# Patient Record
Sex: Male | Born: 1950 | Race: White | Hispanic: No | Marital: Single | Smoking: Former smoker
Health system: Southern US, Community
[De-identification: ages and names within clinical notes are randomized; demographics above are authoritative.]

## PROBLEM LIST (undated history)

## (undated) DIAGNOSIS — I509 Heart failure, unspecified: Secondary | ICD-10-CM

## (undated) DIAGNOSIS — E119 Type 2 diabetes mellitus without complications: Secondary | ICD-10-CM

## (undated) DIAGNOSIS — J449 Chronic obstructive pulmonary disease, unspecified: Secondary | ICD-10-CM

## (undated) HISTORY — PX: HERNIA REPAIR: SHX51

## (undated) HISTORY — PX: CHOLECYSTECTOMY: SHX55

---

## 2013-09-29 ENCOUNTER — Emergency Department (HOSPITAL_COMMUNITY): Payer: Medicare Other

## 2013-09-29 ENCOUNTER — Emergency Department (HOSPITAL_COMMUNITY)
Admission: EM | Admit: 2013-09-29 | Discharge: 2013-09-29 | Disposition: A | Discharge status: Home / Self Care | Payer: Medicare Other | Attending: Emergency Medicine | Admitting: Emergency Medicine

## 2013-09-29 ENCOUNTER — Encounter (HOSPITAL_COMMUNITY): Payer: Self-pay | Admitting: Emergency Medicine

## 2013-09-29 DIAGNOSIS — S1093XA Contusion of unspecified part of neck, initial encounter: Secondary | ICD-10-CM | POA: Diagnosis not present

## 2013-09-29 DIAGNOSIS — J449 Chronic obstructive pulmonary disease, unspecified: Secondary | ICD-10-CM | POA: Insufficient documentation

## 2013-09-29 DIAGNOSIS — W1809XA Striking against other object with subsequent fall, initial encounter: Secondary | ICD-10-CM | POA: Insufficient documentation

## 2013-09-29 DIAGNOSIS — S0003XA Contusion of scalp, initial encounter: Secondary | ICD-10-CM | POA: Diagnosis not present

## 2013-09-29 DIAGNOSIS — E119 Type 2 diabetes mellitus without complications: Secondary | ICD-10-CM | POA: Diagnosis not present

## 2013-09-29 DIAGNOSIS — T07XXXA Unspecified multiple injuries, initial encounter: Secondary | ICD-10-CM

## 2013-09-29 DIAGNOSIS — Z87891 Personal history of nicotine dependence: Secondary | ICD-10-CM | POA: Diagnosis not present

## 2013-09-29 DIAGNOSIS — I509 Heart failure, unspecified: Secondary | ICD-10-CM | POA: Insufficient documentation

## 2013-09-29 DIAGNOSIS — S0083XA Contusion of other part of head, initial encounter: Principal | ICD-10-CM | POA: Insufficient documentation

## 2013-09-29 DIAGNOSIS — Y9389 Activity, other specified: Secondary | ICD-10-CM | POA: Insufficient documentation

## 2013-09-29 DIAGNOSIS — Y929 Unspecified place or not applicable: Secondary | ICD-10-CM | POA: Insufficient documentation

## 2013-09-29 DIAGNOSIS — IMO0002 Reserved for concepts with insufficient information to code with codable children: Secondary | ICD-10-CM | POA: Diagnosis not present

## 2013-09-29 DIAGNOSIS — S0990XA Unspecified injury of head, initial encounter: Secondary | ICD-10-CM | POA: Diagnosis present

## 2013-09-29 DIAGNOSIS — J4489 Other specified chronic obstructive pulmonary disease: Secondary | ICD-10-CM | POA: Insufficient documentation

## 2013-09-29 HISTORY — DX: Heart failure, unspecified: I50.9

## 2013-09-29 HISTORY — DX: Chronic obstructive pulmonary disease, unspecified: J44.9

## 2013-09-29 HISTORY — DX: Type 2 diabetes mellitus without complications: E11.9

## 2013-09-29 NOTE — Discharge Instructions (Signed)
Your exam is negative for any neurologic changes. Your CT scan is negative for any acute problems. Please return if any changes or concerns.

## 2013-09-29 NOTE — ED Provider Notes (Signed)
CSN: [id number]     Arrival date & time 09/29/13  1954 History   First MD Initiated Contact with Patient 09/29/13 2045     Chief Complaint  Patient presents with  . Fall     (Consider location/radiation/quality/duration/timing/severity/associated sxs/prior Treatment) HPI Comments: The patient is a 63 year old male who presents to the emergency department with the complaint of" I fell and hit my head". The patient states that he works on a [employer]. He had on new shoes. The pavement that he was stepping onto his truck was wet, and his foot slipped and he fell backwards and hit his head. He sustained some abrasions on his arms and legs. He states that he did not lose consciousness. He has not had any dizziness or blurred vision. He is concerned because approximately 1-1/2 years ago his wife died following a fall because of an aneurysm. He request to be evaluated at this time.``````````````````````  Patient is a 63 y.o. male presenting with fall. The history is provided by the patient.  Fall This is a new problem. The current episode started today. Pertinent negatives include no abdominal pain, arthralgias, chest pain, coughing or neck pain.    Past Medical History  Diagnosis Date  . COPD (chronic obstructive pulmonary disease)   . CHF (congestive heart failure)   . Diabetes mellitus without complication    Past Surgical History  Procedure Laterality Date  . Cholecystectomy    . Hernia repair     History reviewed. No pertinent family history. History  Substance Use Topics  . Smoking status: Former Smoker    Quit date: 03/12/2008  . Smokeless tobacco: Not on file  . Alcohol Use: Not on file    Review of Systems  Constitutional: Negative for activity change.       All ROS Neg except as noted in HPI  HENT: Negative for nosebleeds.   Eyes: Negative for photophobia and discharge.  Respiratory: Negative for cough, shortness of breath and wheezing.   Cardiovascular: Negative  for chest pain and palpitations.  Gastrointestinal: Negative for abdominal pain and blood in stool.  Genitourinary: Negative for dysuria, frequency and hematuria.  Musculoskeletal: Negative for arthralgias, back pain and neck pain.  Skin: Negative.   Neurological: Negative for dizziness, seizures and speech difficulty.  Psychiatric/Behavioral: Negative for hallucinations and confusion.      Allergies  Review of patient's allergies indicates no known allergies.  Home Medications   Prior to Admission medications   Not on File   BP 150/83  Pulse 114  Temp(Src) 98.8 F (37.1 C)  Resp 18  Ht 5\' 11"  (1.803 m)  Wt 265 lb (120.203 kg)  BMI 36.98 kg/m2  SpO2 93% Physical Exam  Nursing note and vitals reviewed. Constitutional: He is oriented to person, place, and time. He appears well-developed and well-nourished.  Non-toxic appearance.  HENT:  Head: Normocephalic.  Right Ear: Tympanic membrane and external ear normal.  Left Ear: Tympanic membrane and external ear normal.  Eyes: EOM and lids are normal. Pupils are equal, round, and reactive to light.  Neck: Normal range of motion. Neck supple. Carotid bruit is not present.  Cardiovascular: Normal rate, regular rhythm, normal heart sounds, intact distal pulses and normal pulses.   Pulmonary/Chest: Breath sounds normal. No respiratory distress.  Abdominal: Soft. Bowel sounds are normal. There is no tenderness. There is no guarding.  Musculoskeletal: Normal range of motion.  There are shallow abrasions of the right and left arm, the right or left lower legs.  There is full range of motion of upper and lower extremities.  Lymphadenopathy:       Head (right side): No submandibular adenopathy present.       Head (left side): No submandibular adenopathy present.    He has no cervical adenopathy.  Neurological: He is alert and oriented to person, place, and time. He has normal strength. No cranial nerve deficit or sensory deficit. He  exhibits normal muscle tone. Coordination normal.  Gait is steady and intact. Speech is clear and understandable.  Skin: Skin is warm and dry.  Psychiatric: He has a normal mood and affect. His speech is normal.    ED Course  Procedures (including critical care time) Labs Review Labs Reviewed - No data to display  Imaging Review No results found.   EKG Interpretation None      MDM The CT scan is negative for any acute changes or problems. The patient's heart rate is 114, however he states that his resting rate is usually elevated because of his history of COPD and cardiac related issues. No gross neurologic deficits appreciated. Feel that it is safe for the patient to be discharged home.    Final diagnoses:  None    **I have reviewed nursing notes, vital signs, and all appropriate lab and imaging results for this patient.Kathie Dike*    Amarie Viles M Sherrel Shafer, PA-C 09/29/13 2154

## 2013-09-29 NOTE — ED Notes (Signed)
Tripped over shoes on wet pavement, fell backwards hitting head on blacktop. No LOC. Here for check up at his son's insistance

## 2013-10-03 NOTE — ED Provider Notes (Signed)
Medical screening examination/treatment/procedure(s) were performed by non-physician practitioner and as supervising physician I was immediately available for consultation/collaboration.   EKG Interpretation None       Hurman HornJohn M Kimothy Kishimoto, MD 10/03/13 [phone]

## 2014-12-29 IMAGING — CT CT HEAD W/O CM
1 series · 16 of 30 positions shown, 20 images · non-contrast
Comparison: None.

CLINICAL DATA: Fall with posterior head injury.

EXAM:
CT HEAD WITHOUT CONTRAST
TECHNIQUE: Contiguous axial images were obtained from the base of the skull
through the vertex without intravenous contrast.

[Series 2: headtrauma 4.8 h37s · axial · 0.50mm/px · z∈[+134,+276]mm · 16 of 30 slices shown, 20 images]
[im 2/30  brain]
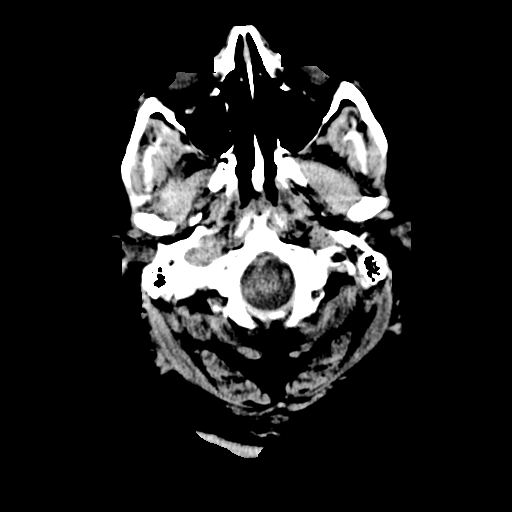
[im 2/30  bone]
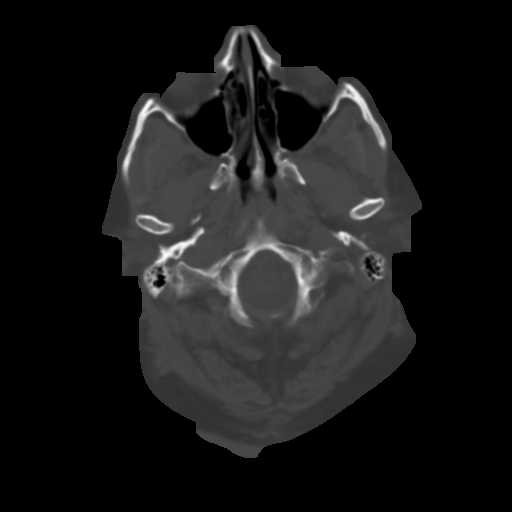
[im 4/30  brain]
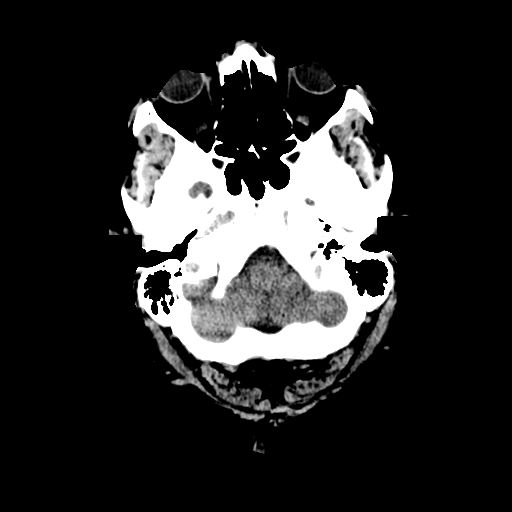
[im 6/30  brain]
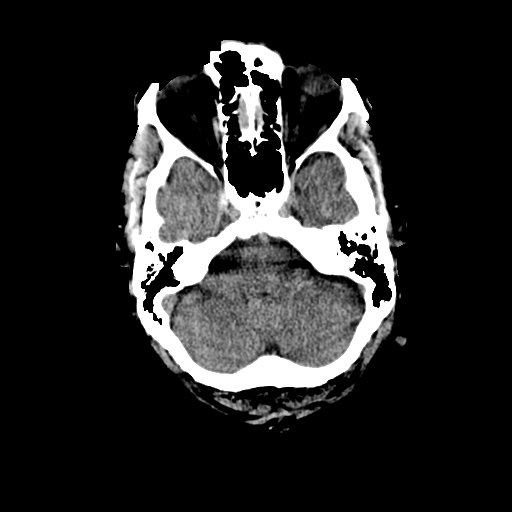
[im 8/30  brain]
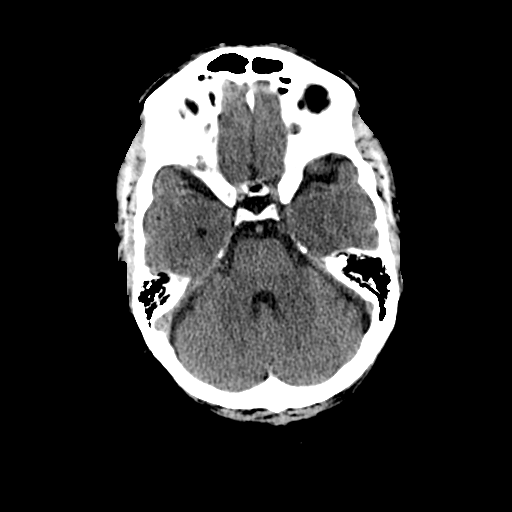
[im 9/30  brain]
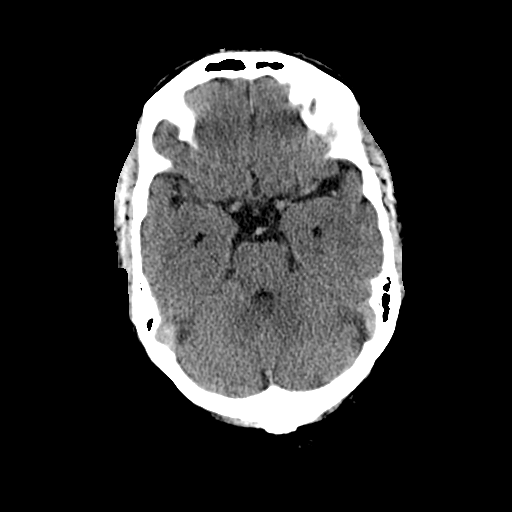
[im 9/30  bone]
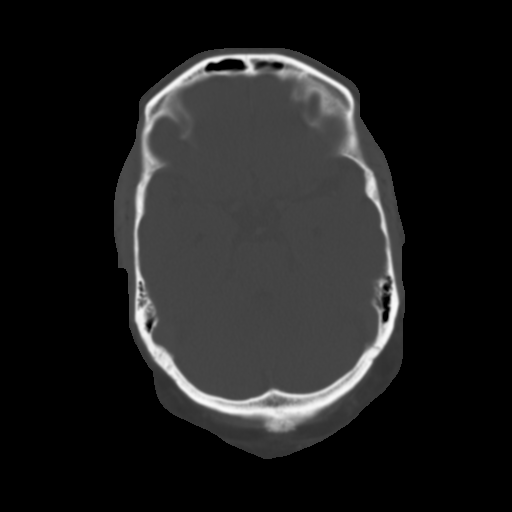
[im 11/30  brain]
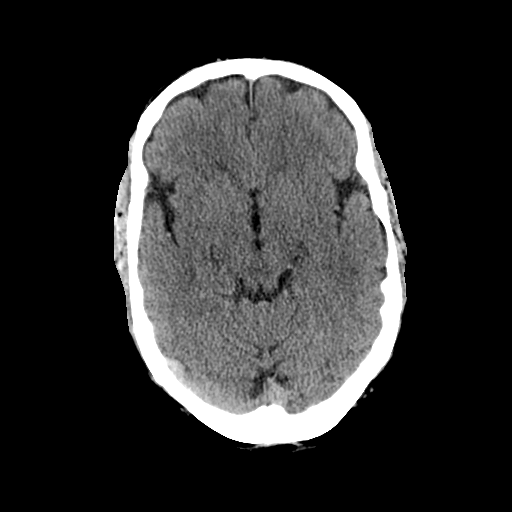
[im 13/30  brain]
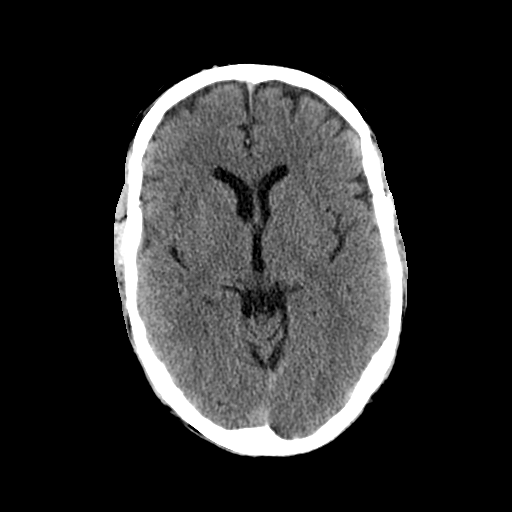
[im 15/30  brain]
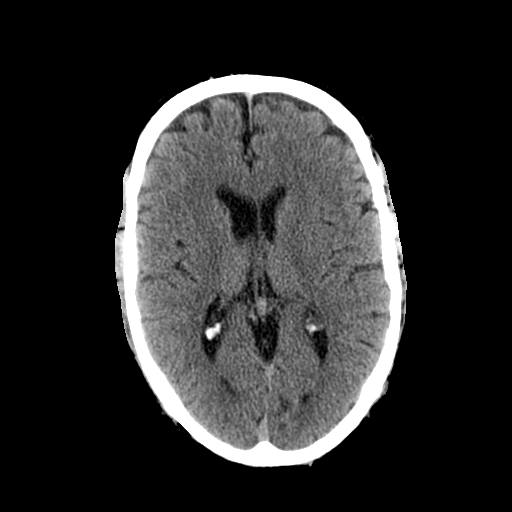
[im 16/30  brain]
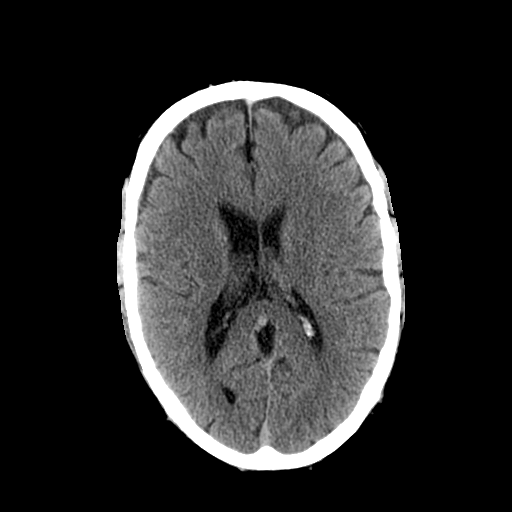
[im 16/30  bone]
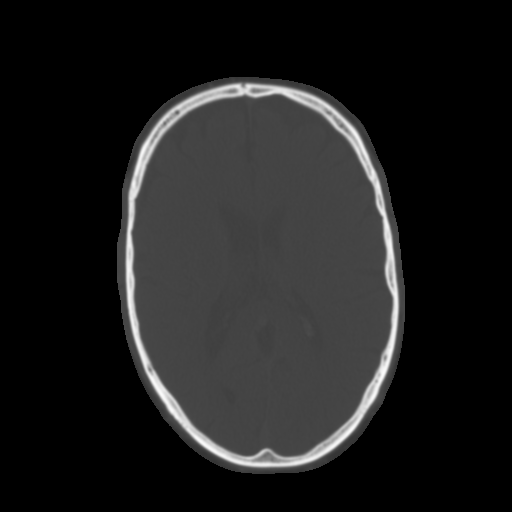
[im 18/30  brain]
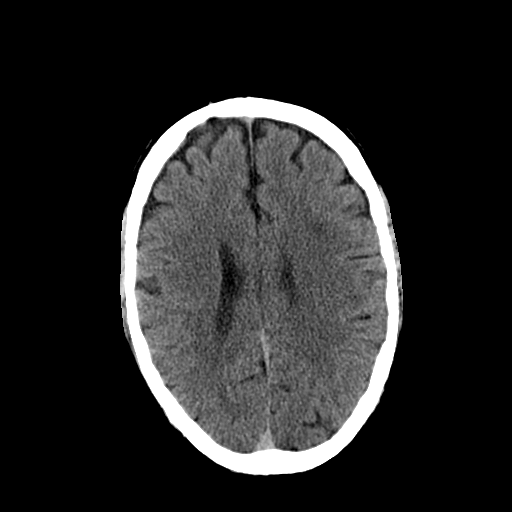
[im 20/30  brain]
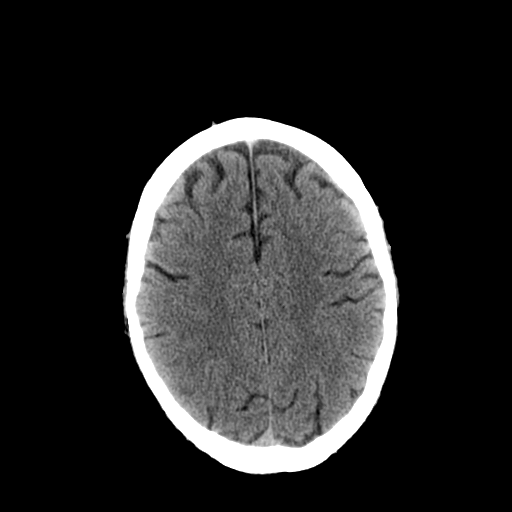
[im 22/30  brain]
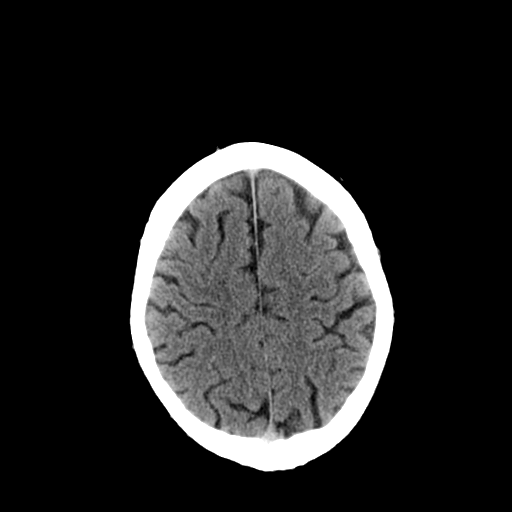
[im 23/30  brain]
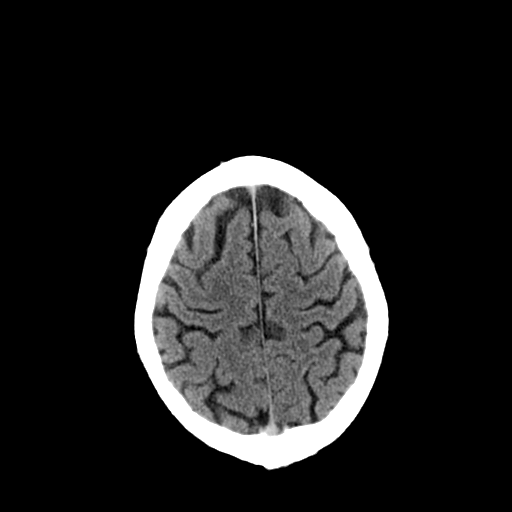
[im 23/30  bone]
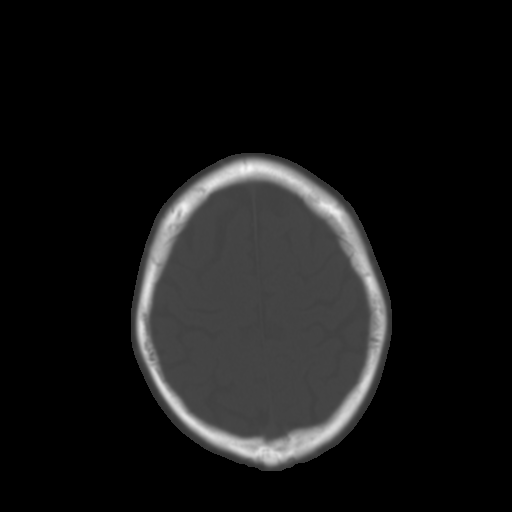
[im 25/30  brain]
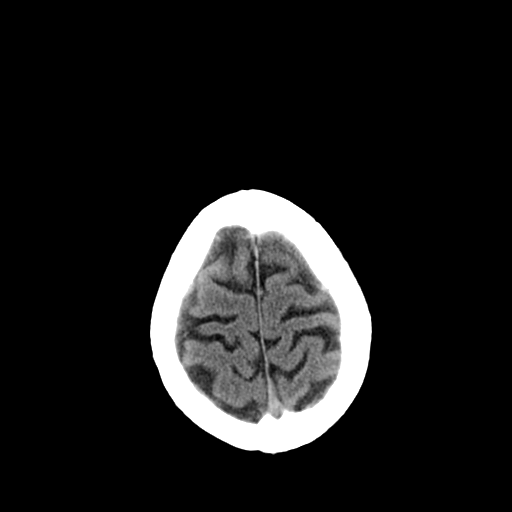
[im 27/30  brain]
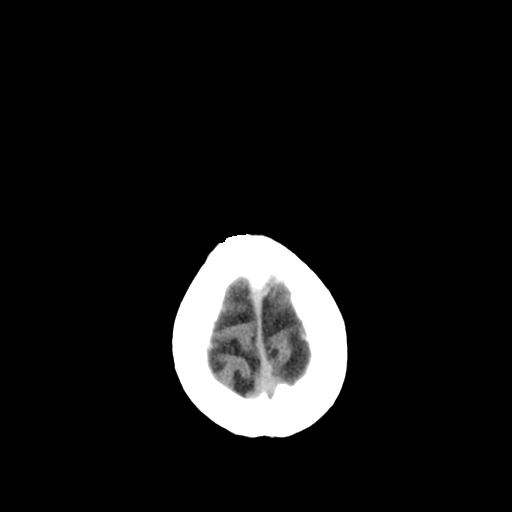
[im 29/30  brain]
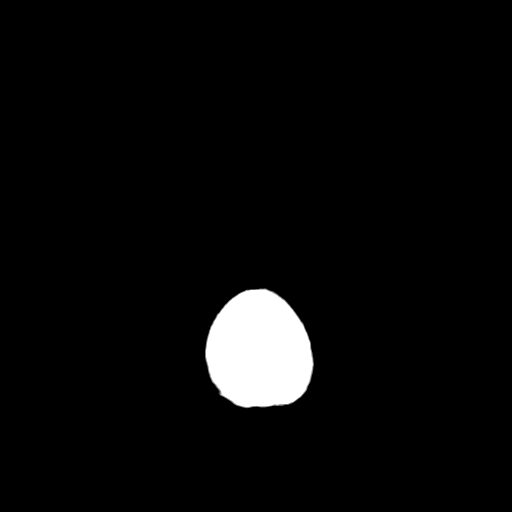

[16 of 30 positions shown; findings below may reference images not displayed]

FINDINGS: There is no evidence of acute intracranial hemorrhage, mass lesion,
brain edema or extra-axial fluid collection. The ventricles and
subarachnoid spaces are appropriately sized for age. There is no CT
evidence of acute cortical infarction. There is minimal
periventricular low-density, asymmetric to the left.

The visualized paranasal sinuses, mastoid air cells and middle ears
are clear. The calvarium is intact.
IMPRESSION: No acute intracranial or calvarial findings. Minimal periventricular
white matter disease, likely reflecting chronic small vessel
ischemic changes.
